# Patient Record
Sex: Female | Born: 1965 | Race: White | Hispanic: No | Marital: Married | Smoking: Never smoker
Health system: Southern US, Community
[De-identification: ages and names within clinical notes are randomized; demographics above are authoritative.]

## PROBLEM LIST (undated history)

## (undated) DIAGNOSIS — K219 Gastro-esophageal reflux disease without esophagitis: Secondary | ICD-10-CM

## (undated) DIAGNOSIS — E669 Obesity, unspecified: Secondary | ICD-10-CM

## (undated) DIAGNOSIS — N200 Calculus of kidney: Secondary | ICD-10-CM

## (undated) DIAGNOSIS — I10 Essential (primary) hypertension: Secondary | ICD-10-CM

## (undated) HISTORY — DX: Obesity, unspecified: E66.9

## (undated) HISTORY — DX: Essential (primary) hypertension: I10

## (undated) HISTORY — DX: Calculus of kidney: N20.0

## (undated) HISTORY — DX: Gastro-esophageal reflux disease without esophagitis: K21.9

---

## 2017-09-15 ENCOUNTER — Encounter: Payer: Self-pay | Admitting: Gastroenterology

## 2017-09-23 ENCOUNTER — Other Ambulatory Visit: Payer: Self-pay

## 2017-09-23 DIAGNOSIS — R079 Chest pain, unspecified: Secondary | ICD-10-CM

## 2017-09-25 ENCOUNTER — Telehealth (HOSPITAL_COMMUNITY): Payer: Self-pay | Admitting: *Deleted

## 2017-09-25 NOTE — Telephone Encounter (Signed)
Left message on voicemail in reference to upcoming appointment scheduled for 09/30/17. Phone number given for a call back so details instructions can be given.  Dannielle Baskins Jacqueline   

## 2017-09-26 ENCOUNTER — Telehealth (HOSPITAL_COMMUNITY): Payer: Self-pay

## 2017-09-30 ENCOUNTER — Ambulatory Visit (INDEPENDENT_AMBULATORY_CARE_PROVIDER_SITE_OTHER): Payer: BC Managed Care – PPO

## 2017-09-30 DIAGNOSIS — R079 Chest pain, unspecified: Secondary | ICD-10-CM | POA: Diagnosis not present

## 2017-10-01 ENCOUNTER — Ambulatory Visit: Payer: BC Managed Care – PPO

## 2017-10-01 LAB — MYOCARDIAL PERFUSION IMAGING
CHL RATE OF PERCEIVED EXERTION: 15
CSEPED: 6 min
Estimated workload: 7 METS
Exercise duration (sec): 0 s
LV sys vol: 98 mL
LVDIAVOL: 37 mL (ref 46–106)
MPHR: 166 {beats}/min
Peak HR: 153 {beats}/min
Percent HR: 92 %
Rest HR: 73 {beats}/min
SDS: 0
SRS: 3
SSS: 3

## 2017-10-01 MED ORDER — TECHNETIUM TC 99M TETROFOSMIN IV KIT
28.3000 | PACK | Freq: Once | INTRAVENOUS | Status: AC | PRN
  Administered 2017-10-01: 28.3 via INTRAVENOUS

## 2017-10-17 ENCOUNTER — Ambulatory Visit: Payer: BC Managed Care – PPO | Admitting: Gastroenterology

## 2017-10-17 ENCOUNTER — Encounter: Payer: Self-pay | Admitting: Gastroenterology

## 2017-10-17 VITALS — BP 136/92 | HR 84 | Ht 68.5 in | Wt 267.5 lb

## 2017-10-17 DIAGNOSIS — R1310 Dysphagia, unspecified: Secondary | ICD-10-CM | POA: Diagnosis not present

## 2017-10-17 DIAGNOSIS — K219 Gastro-esophageal reflux disease without esophagitis: Secondary | ICD-10-CM

## 2017-10-17 MED ORDER — ESOMEPRAZOLE MAGNESIUM 40 MG PO CPDR: 40.0000 mg | DELAYED_RELEASE_CAPSULE | Freq: Every day | ORAL | 11 refills | Status: AC

## 2017-10-17 MED ORDER — RANITIDINE HCL 300 MG PO TABS: 300.0000 mg | ORAL_TABLET | Freq: Every day | ORAL | 11 refills | Status: AC

## 2017-10-17 NOTE — Patient Instructions (Addendum)
If you are age 54 or older, your body mass index should be between 23-30. Your Body mass index is 40.08 kg/m. If this is out of the aforementioned range listed, please consider follow up with your Primary Care Provider.  If you are age 10 or younger, your body mass index should be between 19-25. Your Body mass index is 40.08 kg/m. If this is out of the aformentioned range listed, please consider follow up with your Primary Care Provider.   You have been scheduled for an endoscopy. Please follow written instructions given to you at your visit today. If you use inhalers (even only as needed), please bring them with you on the day of your procedure. Your physician has requested that you go to www.startemmi.com and enter the access code given to you at your visit today. This web site gives a general overview about your procedure. However, you should still follow specific instructions given to you by our office regarding your preparation for the procedure.  We have sent the following medications to your pharmacy for you to pick up at your convenience: Nexium 40 mg .  Zantac 300 mg   You have been scheduled for a Barium Esophogram at [facility] (1st floor of the hospital) on 11/03/17 at 10:30am. Please arrive 15 minutes prior to your appointment for registration. Make certain not to have anything to eat or drink 3 hours prior to your test. If you need to reschedule for any reason, please contact radiology at [phone] to do so. __________________________________________________________________ A barium swallow is an examination that concentrates on views of the esophagus. This tends to be a double contrast exam (barium and two liquids which, when combined, create a gas to distend the wall of the oesophagus) or single contrast (non-ionic iodine based). The study is usually tailored to your symptoms so a good history is essential. Attention is paid during the study to the form, structure and  configuration of the esophagus, looking for functional disorders (such as aspiration, dysphagia, achalasia, motility and reflux) EXAMINATION You may be asked to change into a gown, depending on the type of swallow being performed. A radiologist and radiographer will perform the procedure. The radiologist will advise you of the type of contrast selected for your procedure and direct you during the exam. You will be asked to stand, sit or lie in several different positions and to hold a small amount of fluid in your mouth before being asked to swallow while the imaging is performed .In some instances you may be asked to swallow barium coated marshmallows to assess the motility of a solid food bolus. The exam can be recorded as a digital or video fluoroscopy procedure. POST PROCEDURE It will take 1-2 days for the barium to pass through your system. To facilitate this, it is important, unless otherwise directed, to increase your fluids for the next 24-48hrs and to resume your normal diet.  This test typically takes about 30 minutes to perform. __________________________________________________________________________________   Thank you,  Dr. Lynann Bologna

## 2017-10-17 NOTE — Progress Notes (Signed)
Chief Complaint: Dysphagia  Referring Provider: Dr. Jeanie Sewer      ASSESSMENT AND PLAN;   #1. GERD with esophageal dysphagia. Differential diagnoses includes esophageal stricture, Schatzki's ring, motility disorder, eosinophilic esophagitis, pill induced esophagitis, rule out esophageal carcinoma or extrinsic lesions.   #2. NCCP, neg stress test 09/2017 at [facility].  Plan: - Nexium 40mg  po QAM to continue. - Zantac 300mg  po qhs to continue. - Ba swallow with barium tablet. - EGD with dil at [facility] with possible esophageal biopsies to rule out EoE. - I have instructed patient that she needs to chew foods especially meats and breads well and eat slowly. - Nonpharmacologic means of reflux control.   HPI:    Leslie Cunningham is a 52 y.o. female  With gastroesophageal reflux-heartburn intermittently over last several years Has been having problems with dysphagia mostly to solids, intermittent, mid chest, over the last 1 month, better with Nexium 40 mg p.o. once a day Had unexplained chest pains-cardiac stress test was neg, 2 weeks ago at [facility] No nausea or vomiting No abdominal pain No fever or chills No significant diarrhea constipation No weight loss. Does eat late since her son is in the band  Colon at age 39. 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 GERD (gastroesophageal reflux disease)   . Hypertension   . Kidney stone   . Obesity     History reviewed. No pertinent surgical history.  Family History  Problem Relation Age of Onset  . Breast cancer Mother   . Diabetes Father        stroke  . Heart disease Father   . Heart disease Maternal Grandfather   . Colon cancer Paternal Grandmother 15    Social History   Tobacco Use  . Smoking status: Never Smoker  . Smokeless tobacco: Never Used  Substance Use  Topics  . Alcohol use: Not Currently  . Drug use: Never    Current Outpatient Medications  Medication Sig Dispense Refill  . esomeprazole (NEXIUM) 40 MG capsule Take 40 mg by mouth daily at 12 noon.    Marland Kitchen atorvastatin (LIPITOR) 10 MG tablet Take 10 mg by mouth at bedtime.  0  . losartan (COZAAR) 50 MG tablet Take 50 mg by mouth at bedtime.  0  . ranitidine (ZANTAC) 300 MG tablet Take 300 mg by mouth at bedtime.  2   No current facility-administered medications for this visit.     Allergies  Allergen Reactions  . Penicillins Rash    Review of Systems:  Constitutional: Denies fever, chills, diaphoresis, appetite change and fatigue.  HEENT: Denies photophobia, eye pain, redness, hearing loss, ear pain, congestion, sore throat, rhinorrhea, sneezing, mouth sores, neck pain, neck stiffness and tinnitus.   Respiratory: Denies SOB, DOE, cough, chest tightness,  and wheezing.   Cardiovascular: Denies chest pain, palpitations and leg swelling.  Genitourinary: Denies dysuria, urgency, frequency, hematuria, flank pain and difficulty urinating.  Musculoskeletal: Denies myalgias, back pain, joint swelling, arthralgias and gait problem.  Skin: No rash.  Neurological: Denies dizziness, seizures, syncope, weakness, light-headedness, numbness and headaches.  Hematological: Denies adenopathy. Easy bruising, personal or family bleeding history  Psychiatric/Behavioral: No anxiety or depression     Physical Exam:    BP (!) 136/92   Pulse 84   Ht 5' 8.5" (1.74 m)   Wt  267 lb 8 oz (121.3 kg)   BMI 40.08 kg/m  Filed Weights   10/17/17 1352  Weight: 267 lb 8 oz (121.3 kg)   Constitutional:  Well-developed, in no acute distress. Psychiatric: Normal mood and affect. Behavior is normal. HEENT: Pupils normal.  Conjunctivae are normal. No scleral icterus. Neck supple.  Cardiovascular: Normal rate, regular rhythm. No edema Pulmonary/chest: Effort normal and breath sounds normal. No wheezing, rales  or rhonchi. Abdominal: Soft, nondistended. Nontender. Bowel sounds active throughout. There are no masses palpable. No hepatomegaly. Rectal:  defered Neurological: Alert and oriented to person place and time. Skin: Skin is warm and dry. No rashes noted.    Edman Circle, MD 10/17/2017, 1:59 PM  Cc: Dr. Jeanie Sewer

## 2017-11-03 ENCOUNTER — Ambulatory Visit (HOSPITAL_COMMUNITY): Payer: BC Managed Care – PPO

## 2017-11-05 ENCOUNTER — Ambulatory Visit (HOSPITAL_COMMUNITY)
Admission: RE | Admit: 2017-11-05 | Discharge: 2017-11-05 | Disposition: A | Discharge status: Home / Self Care | Payer: BC Managed Care – PPO | Source: Ambulatory Visit | Attending: Gastroenterology | Admitting: Gastroenterology

## 2017-11-05 DIAGNOSIS — R1310 Dysphagia, unspecified: Secondary | ICD-10-CM

## 2017-11-05 DIAGNOSIS — K449 Diaphragmatic hernia without obstruction or gangrene: Secondary | ICD-10-CM | POA: Insufficient documentation

## 2017-11-05 DIAGNOSIS — K219 Gastro-esophageal reflux disease without esophagitis: Secondary | ICD-10-CM | POA: Diagnosis present

## 2017-11-11 ENCOUNTER — Encounter: Payer: Self-pay | Admitting: Gastroenterology

## 2017-11-25 ENCOUNTER — Encounter: Payer: BC Managed Care – PPO | Admitting: Gastroenterology

## 2019-02-16 IMAGING — NM NM MYOCAR PERF WALL MOTION
6 series · 36 of 36 positions shown · non-contrast
Comparison: none

[Series 1: stress sax gs · 6.4mm · 6.40mm/px · 6 of 216 frames shown]
[frame 19/216]
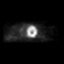
[frame 55/216]
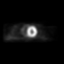
[frame 91/216]
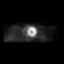
[frame 127/216]
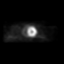
[frame 163/216]
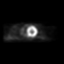
[frame 199/216]
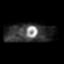

[Series 1: wbr stress-gsp · 6.40mm/px · 6 of 501 frames shown]
[frame 42/501  full-range]
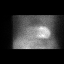
[frame 126/501  full-range]
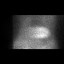
[frame 209/501  full-range]
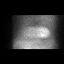
[frame 293/501  full-range]
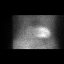
[frame 376/501  full-range]
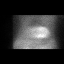
[frame 460/501  full-range]
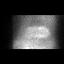

[Series 2: stress sax · 6.4mm · 6.40mm/px · 6 of 27 frames shown]
[frame 3/27]
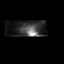
[frame 7/27]
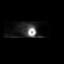
[frame 12/27]
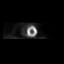
[frame 16/27]
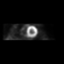
[frame 21/27]
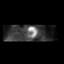
[frame 25/27]
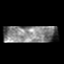

[Series 2: wbr stress-sum-em · 6.40mm/px · 6 of 64 frames shown]
[frame 6/64]
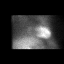
[frame 16/64]
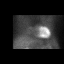
[frame 27/64]
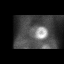
[frame 38/64]
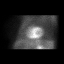
[frame 48/64]
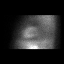
[frame 59/64]
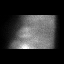

[Series 3: wbr rest · 6.40mm/px · 6 of 64 frames shown]
[frame 6/64]
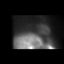
[frame 16/64]
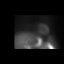
[frame 27/64]
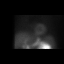
[frame 38/64]
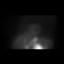
[frame 48/64]
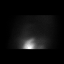
[frame 59/64]
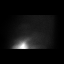

[Series 3: rest sax · 6.4mm · 6.40mm/px · 6 of 25 frames shown]
[frame 3/25]
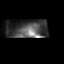
[frame 7/25]
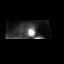
[frame 11/25]
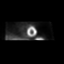
[frame 15/25]
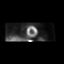
[frame 19/25]
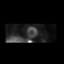
[frame 23/25]
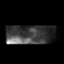

[36 of 36 positions shown; findings below may reference images not displayed]

Canned report from images found in remote index.

Refer to host system for actual result text.
